# Patient Record
Sex: Male | Born: 1990
Health system: Southern US, Community
[De-identification: ages and names within clinical notes are randomized; demographics above are authoritative.]

## PROBLEM LIST (undated history)

## (undated) DIAGNOSIS — J45909 Unspecified asthma, uncomplicated: Secondary | ICD-10-CM

## (undated) DIAGNOSIS — I10 Essential (primary) hypertension: Secondary | ICD-10-CM

## (undated) DIAGNOSIS — G43909 Migraine, unspecified, not intractable, without status migrainosus: Secondary | ICD-10-CM

## (undated) DIAGNOSIS — T7840XA Allergy, unspecified, initial encounter: Secondary | ICD-10-CM

## (undated) HISTORY — DX: Allergy, unspecified, initial encounter: T78.40XA

## (undated) HISTORY — DX: Essential (primary) hypertension: I10

## (undated) HISTORY — DX: Migraine, unspecified, not intractable, without status migrainosus: G43.909

## (undated) HISTORY — PX: TONSILLECTOMY: SUR1361

---

## 2019-01-04 ENCOUNTER — Ambulatory Visit (HOSPITAL_COMMUNITY)
Admission: EM | Admit: 2019-01-04 | Discharge: 2019-01-04 | Disposition: A | Discharge status: Home / Self Care | Payer: 59

## 2019-01-04 ENCOUNTER — Ambulatory Visit (INDEPENDENT_AMBULATORY_CARE_PROVIDER_SITE_OTHER): Payer: 59

## 2019-01-04 ENCOUNTER — Encounter (HOSPITAL_COMMUNITY): Payer: Self-pay | Admitting: Family Medicine

## 2019-01-04 DIAGNOSIS — S6991XA Unspecified injury of right wrist, hand and finger(s), initial encounter: Secondary | ICD-10-CM

## 2019-01-04 HISTORY — DX: Unspecified asthma, uncomplicated: J45.909
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
## 2019-01-04 NOTE — Discharge Instructions (Signed)
The x ray didn't show any acute fracture.  You need to see a hand specialist to rule out any tendon injury.  I would splint the finger for now.  Ibuprofen for pain

## 2019-04-29 ENCOUNTER — Ambulatory Visit: Payer: BC Managed Care – PPO | Admitting: Medical

## 2019-04-29 ENCOUNTER — Encounter: Payer: Self-pay | Admitting: Medical

## 2019-04-29 ENCOUNTER — Other Ambulatory Visit: Payer: Self-pay

## 2019-04-29 VITALS — BP 130/82 | HR 94 | Temp 97.7°F | Wt 275.4 lb

## 2019-04-29 DIAGNOSIS — K529 Noninfective gastroenteritis and colitis, unspecified: Secondary | ICD-10-CM

## 2019-04-29 DIAGNOSIS — Z1159 Encounter for screening for other viral diseases: Secondary | ICD-10-CM

## 2019-04-29 DIAGNOSIS — J4541 Moderate persistent asthma with (acute) exacerbation: Secondary | ICD-10-CM

## 2019-04-29 DIAGNOSIS — Z299 Encounter for prophylactic measures, unspecified: Secondary | ICD-10-CM

## 2019-04-29 DIAGNOSIS — Z79899 Other long term (current) drug therapy: Secondary | ICD-10-CM

## 2019-04-29 DIAGNOSIS — Z113 Encounter for screening for infections with a predominantly sexual mode of transmission: Secondary | ICD-10-CM | POA: Diagnosis not present

## 2019-04-29 MED ORDER — MONTELUKAST SODIUM 10 MG PO TABS: 10.0000 mg | ORAL_TABLET | Freq: Every day | ORAL | 3 refills | Status: DC

## 2019-04-29 MED ORDER — EMTRICITABINE-TENOFOVIR DF 200-300 MG PO TABS: 1.0000 | ORAL_TABLET | Freq: Every day | ORAL | 0 refills | Status: DC

## 2019-04-29 MED ORDER — BECLOMETHASONE DIPROP HFA 40 MCG/ACT IN AERB: 1.0000 | INHALATION_SPRAY | Freq: Two times a day (BID) | RESPIRATORY_TRACT | 5 refills | Status: DC

## 2019-04-29 MED ORDER — ALBUTEROL SULFATE HFA 108 (90 BASE) MCG/ACT IN AERS: 2.0000 | INHALATION_SPRAY | Freq: Four times a day (QID) | RESPIRATORY_TRACT | 1 refills | Status: DC | PRN

## 2019-04-29 NOTE — Progress Notes (Signed)
Subjective:  Albert Hatfield is a 29 y.o. male who presents for Chief Complaint  Patient presents with  . other    new pt. est. and asthma issues and allergies also needs refills on both inhalers albuterol and qvar, was also on prep Truvada would like to switch to Descovy if its better     Here as a new patient today.  Was seeing another provider in [facility] prior  He has a history of asthma.  He has been out of his Singulair and Qvar in the last month.  Having more wheezing of late.  He needs refills.  When he is on the medicines regularly he only has to use the albuterol maybe every other week.  He has asthma problems year-round and is on long-term preventative medication.  he does not smoke cigarettes but he does smoke marijuana  He has a history of high blood pressure.  However this past year after weight loss through healthy diet, exercise and stress reduction leaving a stressful job his blood pressure is improved.  He does note that his kidney marker creatinine had been elevated on a lab last year.  He was concerned whether this was medication related or related to something else  Lately he has been having some loose stools for the last several months.  He can get loose stool 2-3 times per week lasting a few days at a time.  But he also has formed stool as well.  No blood in the stool.  No frequent diarrhea more than 3 times per day.  No recent travel, no new animal exposure.  No recent antibiotic use.  No sick contacts.  He is on PrEP medication.  He is bisexual.  He is considering making a switch to Descovy as he heard some concern about kidney issues with Truvada.  He has been on this about a year.  He is due for STD testing.  No recent symptoms.  No other aggravating or relieving factors.    No other c/o.  Past Medical History:  Diagnosis Date  . Asthma    No current outpatient medications on file prior to visit.   No current facility-administered medications on file  prior to visit.     The following portions of the patient's history were reviewed and updated as appropriate: allergies, current medications, past family history, past medical history, past social history, past surgical history and problem list.  ROS Otherwise as in subjective above    Objective: BP 130/82   Pulse 94   Temp 97.7 F (36.5 C)   Wt 275 lb 6.4 oz (124.9 kg)   General appearance: alert, no distress, well developed, well nourished, African American male Neck: supple, no lymphadenopathy, no thyromegaly, no masses Heart: RRR, normal S1, S2, no murmurs Lungs: CTA bilaterally, no wheezes, rhonchi, or rales Abdomen: +bs, soft, non tender, non distended, no masses, no hepatomegaly, no splenomegaly Pulses: 2+ radial pulses, 2+ pedal pulses, normal cap refill Ext: no edema   Assessment: Encounter Diagnoses  Name Primary?  . Moderate persistent asthma with acute exacerbation Yes  . Chronic diarrhea   . Screen for STD (sexually transmitted disease)   . Prophylactic measure   . High risk medication use   . Need for hepatitis B screening test      Plan: Asthma-refilled Qvar and Singulair to help prevent flareups.  Continue albuterol as needed.  Discussed proper use of medication.  Prophylactic measure, screen for STD-labs today, refill Truvada but consider change  to Descovy.  Discussed condom use, discussed preventative measures.  Plan to follow-up in 3 months  Chronic diarrhea-we discussed possible causes.  This could be related to food changes from where he has made lifestyle changes trying to lose weight.  Pending labs consider stool studies if needed.  Consider food allergy or other causes.    Albert Hatfield was seen today for other.  Diagnoses and all orders for this visit:  Moderate persistent asthma with acute exacerbation -     CBC  Chronic diarrhea -     Comprehensive metabolic panel -     CBC -     TSH  Screen for STD (sexually transmitted disease) -      HIV Antibody (routine testing w rflx) -     RPR -     GC/Chlamydia Probe Amp -     Hepatitis C antibody -     Hepatitis B surface antigen -     Hepatitis B surface antibody,quantitative  Prophylactic measure -     HIV Antibody (routine testing w rflx) -     RPR -     GC/Chlamydia Probe Amp -     Hepatitis C antibody -     Hepatitis B surface antigen -     Hepatitis B surface antibody,quantitative  High risk medication use -     Comprehensive metabolic panel -     CBC -     TSH  Need for hepatitis B screening test -     Hepatitis B surface antigen -     Hepatitis B surface antibody,quantitative  Other orders -     albuterol (VENTOLIN HFA) 108 (90 Base) MCG/ACT inhaler; Inhale 2 puffs into the lungs every 6 (six) hours as needed for wheezing or shortness of breath. -     beclomethasone (QVAR) 40 MCG/ACT inhaler; Inhale 1 puff into the lungs 2 (two) times daily. -     montelukast (SINGULAIR) 10 MG tablet; Take 1 tablet (10 mg total) by mouth at bedtime. -     emtricitabine-tenofovir (TRUVADA) 200-300 MG tablet; Take 1 tablet by mouth daily.    Follow up: pending labs

## 2019-04-30 LAB — CBC
Hematocrit: 50 % (ref 37.5–51.0)
Hemoglobin: 16.3 g/dL (ref 13.0–17.7)
MCH: 27.7 pg (ref 26.6–33.0)
MCHC: 32.6 g/dL (ref 31.5–35.7)
MCV: 85 fL (ref 79–97)
Platelets: 259 10*3/uL (ref 150–450)
RBC: 5.89 x10E6/uL — ABNORMAL HIGH (ref 4.14–5.80)
RDW: 14.8 % (ref 11.6–15.4)
WBC: 9 10*3/uL (ref 3.4–10.8)

## 2019-04-30 LAB — HIV ANTIBODY (ROUTINE TESTING W REFLEX): HIV Screen 4th Generation wRfx: NONREACTIVE

## 2019-04-30 LAB — COMPREHENSIVE METABOLIC PANEL
ALT: 24 IU/L (ref 0–44)
AST: 22 IU/L (ref 0–40)
Albumin/Globulin Ratio: 1.7 (ref 1.2–2.2)
Albumin: 4.9 g/dL (ref 4.1–5.2)
Alkaline Phosphatase: 67 IU/L (ref 39–117)
BUN/Creatinine Ratio: 12 (ref 9–20)
BUN: 15 mg/dL (ref 6–20)
Bilirubin Total: 0.4 mg/dL (ref 0.0–1.2)
CO2: 22 mmol/L (ref 20–29)
Calcium: 10.1 mg/dL (ref 8.7–10.2)
Chloride: 102 mmol/L (ref 96–106)
Creatinine, Ser: 1.3 mg/dL — ABNORMAL HIGH (ref 0.76–1.27)
GFR calc Af Amer: 87 mL/min/{1.73_m2} (ref >59)
GFR calc non Af Amer: 75 mL/min/{1.73_m2} (ref >59)
Globulin, Total: 2.9 g/dL (ref 1.5–4.5)
Glucose: 94 mg/dL (ref 65–99)
Potassium: 4.9 mmol/L (ref 3.5–5.2)
Sodium: 142 mmol/L (ref 134–144)
Total Protein: 7.8 g/dL (ref 6.0–8.5)

## 2019-04-30 LAB — HEPATITIS B SURFACE ANTIGEN: Hepatitis B Surface Ag: NEGATIVE

## 2019-04-30 LAB — TSH: TSH: 0.791 u[IU]/mL (ref 0.450–4.500)

## 2019-04-30 LAB — HEPATITIS C ANTIBODY: Hep C Virus Ab: 0.1 s/co ratio (ref 0.0–0.9)

## 2019-04-30 LAB — HEPATITIS B SURFACE ANTIBODY, QUANTITATIVE: Hepatitis B Surf Ab Quant: 3.8 m[IU]/mL — ABNORMAL LOW (ref >9.9)

## 2019-04-30 LAB — RPR: RPR Ser Ql: NONREACTIVE

## 2019-05-02 ENCOUNTER — Other Ambulatory Visit: Payer: Self-pay | Admitting: Medical

## 2019-05-02 MED ORDER — DESCOVY 200-25 MG PO TABS: 1.0000 | ORAL_TABLET | Freq: Every day | ORAL | 0 refills | Status: DC

## 2019-05-03 LAB — GC/CHLAMYDIA PROBE AMP
Chlamydia trachomatis, NAA: NEGATIVE
Neisseria Gonorrhoeae by PCR: NEGATIVE

## 2019-05-05 ENCOUNTER — Other Ambulatory Visit: Payer: Self-pay | Admitting: Medical

## 2019-07-20 ENCOUNTER — Other Ambulatory Visit: Payer: Self-pay

## 2019-07-20 ENCOUNTER — Encounter: Payer: Self-pay | Admitting: Medical

## 2019-07-20 ENCOUNTER — Ambulatory Visit (INDEPENDENT_AMBULATORY_CARE_PROVIDER_SITE_OTHER): Payer: BC Managed Care – PPO | Admitting: Medical

## 2019-07-20 VITALS — BP 112/82 | HR 85 | Ht 71.0 in | Wt 271.8 lb

## 2019-07-20 DIAGNOSIS — Z0000 Encounter for general adult medical examination without abnormal findings: Secondary | ICD-10-CM | POA: Diagnosis not present

## 2019-07-20 DIAGNOSIS — Z23 Encounter for immunization: Secondary | ICD-10-CM

## 2019-07-20 DIAGNOSIS — J301 Allergic rhinitis due to pollen: Secondary | ICD-10-CM | POA: Diagnosis not present

## 2019-07-20 DIAGNOSIS — Z7185 Encounter for immunization safety counseling: Secondary | ICD-10-CM | POA: Insufficient documentation

## 2019-07-20 DIAGNOSIS — Z7189 Other specified counseling: Secondary | ICD-10-CM

## 2019-07-20 DIAGNOSIS — Z113 Encounter for screening for infections with a predominantly sexual mode of transmission: Secondary | ICD-10-CM

## 2019-07-20 DIAGNOSIS — J4540 Moderate persistent asthma, uncomplicated: Secondary | ICD-10-CM | POA: Diagnosis not present

## 2019-07-20 DIAGNOSIS — Z87820 Personal history of traumatic brain injury: Secondary | ICD-10-CM | POA: Diagnosis not present

## 2019-07-20 DIAGNOSIS — G43909 Migraine, unspecified, not intractable, without status migrainosus: Secondary | ICD-10-CM

## 2019-07-20 DIAGNOSIS — G4733 Obstructive sleep apnea (adult) (pediatric): Secondary | ICD-10-CM

## 2019-07-20 DIAGNOSIS — Z299 Encounter for prophylactic measures, unspecified: Secondary | ICD-10-CM

## 2019-07-20 DIAGNOSIS — Z6837 Body mass index (BMI) 37.0-37.9, adult: Secondary | ICD-10-CM

## 2019-07-20 NOTE — Progress Notes (Signed)
Subjective:   HPI  Albert Hatfield is a 29 y.o. male who presents for Chief Complaint  Patient presents with  . Annual Exam    with fasting labs     Patient Care Team: Jamaal Bernasconi, Kermit Balo, PA-C as PCP - General (Family Medicine) Sees dentist Sees eye doctor  Concerns: Here for physical today.  I saw him in April as a new patient to establish care  He continues with Descovy prep without complaint.  Here for STD testing.  He has not used the medicine every single day in the last month due to no sexual activity during it.  His only main concern is prior creatinine elevation in the last several years.  He was treated for blood pressure several years ago but lost down from 350 pounds down to current weight He does  not use NSAIDs regularly.  He is concerned as his grandpa was on dialysis.  He lives alone, his mother just recently moved to [place] to be closer.  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   He is trying to adopt his 16-year-old cousin who is in foster care.  His uncle and aunt lost rights to him in [place], and he wants to intervene as a caregiver so he does not end up in foster care with somebody he does not know  He has a history of sleep apnea, severe in the past.  Last sleep study 2018.  He cannot afford a CPAP so he never used CPAP.  He was 300 pounds at that time.   Reviewed their medical, surgical, family, social, medication, and allergy history and updated chart as appropriate.  Past Medical History:  Diagnosis Date  . Allergy   . Asthma   . Hypertension    in past, was on medication at one point 2013, treated with lifestyle changes currently 2021  . Migraine    since prior concussion 2012    Past Surgical History:  Procedure Laterality Date  . TONSILLECTOMY      Social History   Socioeconomic History  . Marital status: Single    Spouse name: Not on file  . Number of children: Not on file  . Years of education: Not on file  .  Highest education level: Not on file  Occupational History  . Not on file  Tobacco Use  . Smoking status: Never Smoker  . Smokeless tobacco: Never Used  Substance and Sexual Activity  . Alcohol use: Yes    Comment: social, limited  . Drug use: Yes    Types: Marijuana    Comment: for migraines  . Sexual activity: Not on file  Other Topics Concern  . Not on file  Social History Narrative  . Not on file   Social Determinants of Health   Financial Resource Strain:   . Difficulty of Paying Living Expenses:   Food Insecurity:   . Worried About Programme researcher, broadcasting/film/video in the Last Year:   . Barista in the Last Year:   Transportation Needs:   . Freight forwarder (Medical):   Marland Kitchen Lack of Transportation (Non-Medical):   Physical Activity:   . Days of Exercise per Week:   . Minutes of Exercise per Session:   Stress:   . Feeling of Stress :   Social Connections:   . Frequency of Communication with Friends and Family:   . Frequency of Social Gatherings with Friends and Family:   . Attends Religious Services:   .  Active Member of Clubs or Organizations:   . Attends Banker Meetings:   Marland Kitchen Marital Status:   Intimate Partner Violence:   . Fear of Current or Ex-Partner:   . Emotionally Abused:   Marland Kitchen Physically Abused:   . Sexually Abused:     Family History  Problem Relation Age of Onset  . Healthy Mother   . Heart failure Father   . Hypertension Father   . Heart disease Father 42       MI  . Cancer Paternal Aunt        breast  . Hypertension Paternal Grandmother   . Hypertension Paternal Grandfather   . Arthritis Other   . Stroke Neg Hx      Current Outpatient Medications:  .  albuterol (VENTOLIN HFA) 108 (90 Base) MCG/ACT inhaler, Inhale 2 puffs into the lungs every 6 (six) hours as needed for wheezing or shortness of breath., Disp: 18 g, Rfl: 1 .  emtricitabine-tenofovir AF (DESCOVY) 200-25 MG tablet, Take 1 tablet by mouth daily., Disp: 90 tablet,  Rfl: 0 .  montelukast (SINGULAIR) 10 MG tablet, Take 1 tablet (10 mg total) by mouth at bedtime., Disp: 90 tablet, Rfl: 3 .  QVAR 40 MCG/ACT inhaler, INHALE 1 PUFF BY MOUTH TWICE DAILY, Disp: 9 g, Rfl: 5  No Known Allergies    Review of Systems Constitutional: -fever, -chills, -sweats, -unexpected weight change, -decreased appetite, -fatigue Allergy: -sneezing, -itching, -congestion Dermatology: -changing moles, --rash, -lumps ENT: -runny nose, -ear pain, -sore throat, -hoarseness, -sinus pain, -teeth pain, - ringing in ears, -hearing loss, -nosebleeds Cardiology: -chest pain, -palpitations, -swelling, -difficulty breathing when lying flat, -waking up short of breath Respiratory: -cough, -shortness of breath, -difficulty breathing with exercise or exertion, -wheezing, -coughing up blood Gastroenterology: -abdominal pain, -nausea, -vomiting, -diarrhea, -constipation, -blood in stool, -changes in bowel movement, -difficulty swallowing or eating Hematology: -bleeding, -bruising  Musculoskeletal: -joint aches, -muscle aches, -joint swelling, -back pain, -neck pain, -cramping, -changes in gait Ophthalmology: denies vision changes, eye redness, itching, discharge Urology: -burning with urination, -difficulty urinating, -blood in urine, -urinary frequency, -urgency, -incontinence Neurology: -headache, -weakness, -tingling, -numbness, -memory loss, -falls, -dizziness Psychology: -depressed mood, -agitation, -sleep problems Male GU: no testicular mass, pain, no lymph nodes swollen, no swelling, no rash.     Objective:  BP 112/82   Pulse 85   Ht 5\' 11"  (1.803 m)   Wt 271 lb 12.8 oz (123.3 kg)   SpO2 98%   BMI 37.91 kg/m   General appearance: alert, no distress, WD/WN, African American male Skin: unremarkable Neck: supple, no lymphadenopathy, no thyromegaly, no masses, normal ROM, no bruits Chest: non tender, normal shape and expansion Heart: RRR, normal S1, S2, no murmurs Lungs: CTA  bilaterally, no wheezes, rhonchi, or rales Abdomen: +bs, soft, non tender, non distended, no masses, no hepatomegaly, no splenomegaly, no bruits Back: non tender, normal ROM, no scoliosis Musculoskeletal: upper extremities non tender, no obvious deformity, normal ROM throughout, lower extremities non tender, no obvious deformity, normal ROM throughout Extremities: no edema, no cyanosis, no clubbing Pulses: 2+ symmetric, upper and lower extremities, normal cap refill Neurological: alert, oriented x 3, CN2-12 intact, strength normal upper extremities and lower extremities, sensation normal throughout, DTRs 2+ throughout, no cerebellar signs, gait normal Psychiatric: normal affect, behavior normal, pleasant  GU: normal male external genitalia,circumcised, nontender, no masses, no hernia, no lymphadenopathy Rectal: deferred  Assessment and Plan :   Encounter Diagnoses  Name Primary?  . Encounter for health maintenance examination  in adult Yes  . Moderate persistent asthma, unspecified whether complicated   . Allergic rhinitis due to pollen, unspecified seasonality   . History of concussion   . Migraine without status migrainosus, not intractable, unspecified migraine type   . Prophylactic measure   . Vaccine counseling   . Screen for STD (sexually transmitted disease)   . Need for hepatitis B vaccination   . OSA (obstructive sleep apnea)   . BMI 37.0-37.9, adult     Physical exam - discussed and counseled on healthy lifestyle, diet, exercise, preventative care, vaccinations, sick and well care, proper use of emergency dept and after hours care, and addressed their concerns.    Health screening: See your eye doctor yearly for routine vision care. See your dentist yearly for routine dental care including hygiene visits twice yearly.  Discussed STD testing, discussed prevention, condom use, means of transmission  Cancer screening Advised monthly self testicular exam     Vaccinations: Advised yearly influenza vaccine He will get me a copy of his vaccine records  Counseled on the Hepatitis B virus vaccine.  Vaccine information sheet given.  Heplisav B# 1 vaccine given after consent obtained.  Patient was advised to return in 1 months for Heplisav B #2.      Separate significant chronic issues discussed: Asthma-compliant with medications, no recent complaints, on preventative daily inhaler  Prophylactic measure-continue Descovy, discussed risk and benefits of medication, STD screen today  History of sleep apnea-we will refer for updated sleep study  Continue efforts to lose weight through healthy diet and exercise  History of migraines, no recent frequent migraines.   Albert Hatfield was seen today for annual exam.  Diagnoses and all orders for this visit:  Encounter for health maintenance examination in adult -     Lipid panel -     Basic metabolic panel -     HIV Antibody (routine testing w rflx) -     RPR -     GC/Chlamydia Probe Amp  Moderate persistent asthma, unspecified whether complicated  Allergic rhinitis due to pollen, unspecified seasonality  History of concussion  Migraine without status migrainosus, not intractable, unspecified migraine type  Prophylactic measure  Vaccine counseling  Screen for STD (sexually transmitted disease) -     HIV Antibody (routine testing w rflx) -     RPR -     GC/Chlamydia Probe Amp  Need for hepatitis B vaccination  OSA (obstructive sleep apnea) -     Home sleep test  BMI 37.0-37.9, adult  Other orders -     Heplisav-B (HepB-CPG) Vaccine     Follow-up pending labs, yearly for physical

## 2019-07-21 ENCOUNTER — Other Ambulatory Visit: Payer: Self-pay | Admitting: Medical

## 2019-07-21 LAB — BASIC METABOLIC PANEL
BUN/Creatinine Ratio: 13 (ref 9–20)
BUN: 14 mg/dL (ref 6–20)
CO2: 23 mmol/L (ref 20–29)
Calcium: 9.7 mg/dL (ref 8.7–10.2)
Chloride: 104 mmol/L (ref 96–106)
Creatinine, Ser: 1.04 mg/dL (ref 0.76–1.27)
GFR calc Af Amer: 113 mL/min/{1.73_m2} (ref >59)
GFR calc non Af Amer: 98 mL/min/{1.73_m2} (ref >59)
Glucose: 104 mg/dL — ABNORMAL HIGH (ref 65–99)
Potassium: 4.3 mmol/L (ref 3.5–5.2)
Sodium: 141 mmol/L (ref 134–144)

## 2019-07-21 LAB — LIPID PANEL
Chol/HDL Ratio: 5.4 ratio — ABNORMAL HIGH (ref 0.0–5.0)
Cholesterol, Total: 225 mg/dL — ABNORMAL HIGH (ref 100–199)
HDL: 42 mg/dL (ref >39)
LDL Chol Calc (NIH): 169 mg/dL — ABNORMAL HIGH (ref 0–99)
Triglycerides: 78 mg/dL (ref 0–149)
VLDL Cholesterol Cal: 14 mg/dL (ref 5–40)

## 2019-07-21 LAB — RPR: RPR Ser Ql: NONREACTIVE

## 2019-07-21 LAB — GC/CHLAMYDIA PROBE AMP
Chlamydia trachomatis, NAA: NEGATIVE
Neisseria Gonorrhoeae by PCR: NEGATIVE

## 2019-07-21 LAB — HIV ANTIBODY (ROUTINE TESTING W REFLEX): HIV Screen 4th Generation wRfx: NONREACTIVE

## 2019-07-21 MED ORDER — DESCOVY 200-25 MG PO TABS: 1.0000 | ORAL_TABLET | Freq: Every day | ORAL | 0 refills | Status: AC

## 2019-07-26 ENCOUNTER — Other Ambulatory Visit: Payer: Self-pay | Admitting: Medical

## 2019-07-26 ENCOUNTER — Telehealth: Payer: Self-pay

## 2019-07-26 DIAGNOSIS — R7309 Other abnormal glucose: Secondary | ICD-10-CM

## 2019-07-26 DIAGNOSIS — E785 Hyperlipidemia, unspecified: Secondary | ICD-10-CM

## 2019-07-26 NOTE — Telephone Encounter (Signed)
Pt. Called stating that he has sent you a few my chart messages stating that he would like to get his Cholesterol levels rechecked sooner than 3-4 months from now because he was not fasting when his labs were drawn last time. He also feels like he wants to switch providers because you did not listen to him. I told him I would have to send a message back to both providers before being able to switch his PCP. He stated again if there was anyway he could have his cholesterol rechecked sooner since his last one was not fasting and he would like to make sure before he starts any kind of medication.

## 2019-07-26 NOTE — Telephone Encounter (Signed)
Patient has been informed of provider message.  

## 2019-07-26 NOTE — Telephone Encounter (Signed)
First of all I am listening to him and read his responses and questions on email.  I have to make several considerations when we do labs and when we recommend treatment.   Given his age and lack of other major heart disease risk factors, we would not necessarily put him on cholesterol medicine at this point unless we strongly felt the numbers were abnormal, that the medicines he is on was adversely affecting his cholesterol, or if he had genetic predisposition to high cholesterol that he could not change with diet and exercise.    Most insurers will not pay for labs for cholesterol this soon.  Typically an insurance company will not pay for a repeat cholesterol unless its at least 6 weeks after the prior test.  Thus, this is yet another reason why we do not need to do the test that quickly.  Most insurance companies will only pay for cholesterol once yearly anyhow.  For young healthy people without significant heart disease risk factors, without medication that adversely affects the cholesterol, the standard recommendation is to check cholesterol once every 5 years.     So yes may be he feels as though I am being resistant to repeating the test but is for all the reasons above.  I would be happy to repeat the test fasting in 6 weeks at the earliest or in 3 months at his next lab check.  Bottom line the most important thing he can do is eat a low-fat low-cholesterol diet, exercise regularly.  I feel I am very thorough, I have reviewed his chart, and address concerns from the get go.    If he feels as though he needs to find another provider in another office, then he certainly has the freedom to choose who he sees.

## 2019-08-22 ENCOUNTER — Other Ambulatory Visit: Payer: BC Managed Care – PPO

## 2019-10-13 ENCOUNTER — Telehealth: Payer: Self-pay | Admitting: Medical

## 2019-10-13 NOTE — Telephone Encounter (Signed)
Please let him know that yes he can come in in advance of his visit.  I will need to put in labs so send this message back to me.  However I am out of the office till next week due to surgery

## 2019-10-14 NOTE — Telephone Encounter (Signed)
Patient has been informed via mychart. Please place lab order

## 2019-10-17 ENCOUNTER — Other Ambulatory Visit: Payer: Self-pay | Admitting: Medical

## 2019-10-17 DIAGNOSIS — E785 Hyperlipidemia, unspecified: Secondary | ICD-10-CM

## 2019-10-17 DIAGNOSIS — Z6837 Body mass index (BMI) 37.0-37.9, adult: Secondary | ICD-10-CM

## 2019-10-17 DIAGNOSIS — Z113 Encounter for screening for infections with a predominantly sexual mode of transmission: Secondary | ICD-10-CM

## 2019-10-17 DIAGNOSIS — R7301 Impaired fasting glucose: Secondary | ICD-10-CM

## 2019-11-02 ENCOUNTER — Ambulatory Visit: Payer: BC Managed Care – PPO | Admitting: Medical

## 2019-11-03 ENCOUNTER — Encounter: Payer: Self-pay | Admitting: Medical

## 2020-07-31 ENCOUNTER — Encounter: Payer: BC Managed Care – PPO | Admitting: Medical

## 2020-07-31 ENCOUNTER — Telehealth: Payer: Self-pay | Admitting: Medical

## 2020-07-31 NOTE — Telephone Encounter (Signed)

## 2020-08-06 ENCOUNTER — Encounter: Payer: Self-pay | Admitting: Family Medicine

## 2020-08-06 NOTE — Telephone Encounter (Signed)
Dismissal letter sent.

## 2020-09-13 ENCOUNTER — Emergency Department (HOSPITAL_COMMUNITY): Payer: PRIVATE HEALTH INSURANCE

## 2020-09-13 ENCOUNTER — Emergency Department (HOSPITAL_COMMUNITY)
Admission: EM | Admit: 2020-09-13 | Discharge: 2020-09-13 | Disposition: A | Discharge status: Home / Self Care | Payer: PRIVATE HEALTH INSURANCE | Attending: Emergency Medicine | Admitting: Emergency Medicine

## 2020-09-13 ENCOUNTER — Encounter (HOSPITAL_COMMUNITY): Payer: Self-pay | Admitting: Emergency Medicine

## 2020-09-13 DIAGNOSIS — Z79899 Other long term (current) drug therapy: Secondary | ICD-10-CM | POA: Insufficient documentation

## 2020-09-13 DIAGNOSIS — J4521 Mild intermittent asthma with (acute) exacerbation: Secondary | ICD-10-CM | POA: Insufficient documentation

## 2020-09-13 DIAGNOSIS — I10 Essential (primary) hypertension: Secondary | ICD-10-CM | POA: Insufficient documentation

## 2020-09-13 DIAGNOSIS — Z20822 Contact with and (suspected) exposure to covid-19: Secondary | ICD-10-CM | POA: Insufficient documentation

## 2020-09-13 LAB — RESP PANEL BY RT-PCR (FLU A&B, COVID) ARPGX2
Influenza A by PCR: NEGATIVE
Influenza B by PCR: NEGATIVE
SARS Coronavirus 2 by RT PCR: NEGATIVE

## 2020-09-13 MED ORDER — DEXAMETHASONE SODIUM PHOSPHATE 10 MG/ML IJ SOLN
10.0000 mg | Freq: Once | INTRAMUSCULAR | Status: DC
  Filled 2020-09-13: qty 1

## 2020-09-13 MED ORDER — IPRATROPIUM-ALBUTEROL 0.5-2.5 (3) MG/3ML IN SOLN
3.0000 mL | Freq: Once | RESPIRATORY_TRACT | Status: AC
  Administered 2020-09-13: 3 mL via RESPIRATORY_TRACT
  Filled 2020-09-13: qty 3

## 2020-09-13 MED ORDER — AEROCHAMBER PLUS FLO-VU LARGE MISC
1.0000 | Freq: Once | Status: AC
  Administered 2020-09-13: 1

## 2020-09-13 MED ORDER — AEROCHAMBER PLUS FLO-VU LARGE MISC
Status: AC
  Filled 2020-09-13: qty 1

## 2020-09-13 MED ORDER — DEXAMETHASONE SODIUM PHOSPHATE 10 MG/ML IJ SOLN
10.0000 mg | Freq: Once | INTRAMUSCULAR | Status: AC
  Administered 2020-09-13: 10 mg via INTRAVENOUS

## 2020-09-13 MED ORDER — ALBUTEROL SULFATE HFA 108 (90 BASE) MCG/ACT IN AERS
2.0000 | INHALATION_SPRAY | RESPIRATORY_TRACT | Status: DC | PRN
Start: 2020-09-13 — End: 2020-09-13
  Administered 2020-09-13: 2 via RESPIRATORY_TRACT
  Filled 2020-09-13: qty 6.7

## 2020-09-13 MED ORDER — PREDNISONE 10 MG (21) PO TBPK: ORAL_TABLET | Freq: Every day | ORAL | 0 refills | Status: DC

## 2020-09-13 MED ORDER — ALBUTEROL SULFATE HFA 108 (90 BASE) MCG/ACT IN AERS: 2.0000 | INHALATION_SPRAY | Freq: Four times a day (QID) | RESPIRATORY_TRACT | 1 refills | Status: DC | PRN

## 2020-09-13 NOTE — ED Provider Notes (Signed)
[facility] EMERGENCY DEPARTMENT Provider Note   CSN: [id number] Arrival date & time: 09/13/20  1234     History Chief Complaint  Patient presents with   Asthma    Albert Hatfield is a 30 y.o. male.  Pt presents to the ED today with sob and wheezing.  Pt said he has not felt well for the past 2 days.  He has had to use his inhaler yesterday and is now out.  The pt denies any f/c.      Past Medical History:  Diagnosis Date   Allergy    Asthma    Hypertension    in past, was on medication at one point 2013, treated with lifestyle changes currently 2021   Migraine    since prior concussion 2012    Patient Active Problem List   Diagnosis Date Noted   Encounter for health maintenance examination in adult 07/20/2019   Moderate persistent asthma 07/20/2019   Allergic rhinitis due to pollen 07/20/2019   History of concussion 07/20/2019   Migraine without status migrainosus, not intractable 07/20/2019   Prophylactic measure 07/20/2019   Vaccine counseling 07/20/2019   Screen for STD (sexually transmitted disease) 07/20/2019   Need for hepatitis B vaccination 07/20/2019   OSA (obstructive sleep apnea) 07/20/2019   BMI 37.0-37.9, adult 07/20/2019    Past Surgical History:  Procedure Laterality Date   TONSILLECTOMY         Family History  Problem Relation Age of Onset   Healthy Mother    Heart failure Father    Hypertension Father    Heart disease Father 16       MI   Cancer Paternal Aunt        breast   Hypertension Paternal Grandmother    Hypertension Paternal Grandfather    Arthritis Other    Stroke Neg Hx     Social History   Tobacco Use   Smoking status: Never   Smokeless tobacco: Never  Substance Use Topics   Alcohol use: Yes    Comment: social, limited   Drug use: Yes    Types: Marijuana    Comment: for migraines    Home Medications Prior to Admission medications   Medication Sig Start Date End Date Taking? Authorizing  Provider  acetaminophen (TYLENOL) 325 MG tablet Take 650 mg by mouth every 6 (six) hours as needed for headache or mild pain.   Yes [provider]  Ascorbic Acid (VITAMIN C) 1000 MG tablet Take 1,000 mg by mouth daily.   Yes [provider]  emtricitabine-tenofovir AF (DESCOVY) 200-25 MG tablet Take 1 tablet by mouth daily. 07/21/19  Yes Tysinger, Kermit Balo, PA-C  multivitamin (ONE-A-DAY MEN'S) TABS tablet Take 1 tablet by mouth daily.   Yes [provider]  Omega-3 1000 MG CAPS Take 1 capsule by mouth daily.   Yes [provider]  predniSONE (STERAPRED UNI-PAK 21 TAB) 10 MG (21) TBPK tablet Take by mouth daily. Take 6 tabs by mouth daily  for 2 days, then 5 tabs for 2 days, then 4 tabs for 2 days, then 3 tabs for 2 days, 2 tabs for 2 days, then 1 tab by mouth daily for 2 days 09/13/20  Yes Jacalyn Lefevre, MD  QVAR 40 MCG/ACT inhaler INHALE 1 PUFF BY MOUTH TWICE DAILY Patient taking differently: Inhale 1 puff into the lungs in the morning and at bedtime. 05/05/19  Yes Tysinger, Kermit Balo, PA-C  valACYclovir (VALTREX) 1000 MG tablet Take 1,000  mg by mouth 3 (three) times daily. 06/22/20  Yes [provider]  albuterol (VENTOLIN HFA) 108 (90 Base) MCG/ACT inhaler Inhale 2 puffs into the lungs every 6 (six) hours as needed for wheezing or shortness of breath. 09/13/20   Jacalyn Lefevre, MD  montelukast (SINGULAIR) 10 MG tablet Take 1 tablet (10 mg total) by mouth at bedtime. Patient not taking: No sig reported 04/29/19   Jac Canavan, PA-C    Allergies    Patient has no known allergies.  Review of Systems   Review of Systems  Respiratory:  Positive for shortness of breath and wheezing.   All other systems reviewed and are negative.  Physical Exam Updated Vital Signs BP 124/74   Pulse (!) 56   Temp 98.6 F (37 C) (Oral)   Resp 18   SpO2 100%   Physical Exam Vitals and nursing note reviewed.  Constitutional:      Appearance: Normal appearance.   HENT:     Head: Normocephalic and atraumatic.     Right Ear: External ear normal.     Left Ear: External ear normal.     Nose: Nose normal.     Mouth/Throat:     Mouth: Mucous membranes are moist.     Pharynx: Oropharynx is clear.  Eyes:     Extraocular Movements: Extraocular movements intact.     Conjunctiva/sclera: Conjunctivae normal.     Pupils: Pupils are equal, round, and reactive to light.  Cardiovascular:     Rate and Rhythm: Normal rate and regular rhythm.     Pulses: Normal pulses.     Heart sounds: Normal heart sounds.  Pulmonary:     Effort: Pulmonary effort is normal.     Breath sounds: Wheezing present.  Abdominal:     General: Abdomen is flat. Bowel sounds are normal.     Palpations: Abdomen is soft.  Musculoskeletal:        General: Normal range of motion.     Cervical back: Normal range of motion and neck supple.  Skin:    General: Skin is warm.     Capillary Refill: Capillary refill takes less than 2 seconds.  Neurological:     General: No focal deficit present.     Mental Status: He is alert and oriented to person, place, and time.  Psychiatric:        Mood and Affect: Mood normal.        Behavior: Behavior normal.        Thought Content: Thought content normal.    ED Results / Procedures / Treatments   Labs (all labs ordered are listed, but only abnormal results are displayed) Labs Reviewed  RESP PANEL BY RT-PCR (FLU A&B, COVID) ARPGX2    EKG EKG Interpretation  Date/Time:  Thursday September 13 2020 12:37:12 EDT Ventricular Rate:  76 PR Interval:  152 QRS Duration: 88 QT Interval:  370 QTC Calculation: 416 R Axis:   89 Text Interpretation: Normal sinus rhythm Nonspecific T wave abnormality Abnormal ECG No old tracing to compare Confirmed by Jacalyn Lefevre [phone]) on 09/13/2020 4:24:53 PM  Radiology DG Chest 1 View  Result Date: 09/13/2020 CLINICAL DATA:  A 30 year old male with chest pain and shortness of breath. EXAM: CHEST  1 VIEW  COMPARISON:  None FINDINGS: The heart size and mediastinal contours are within normal limits. Both lungs are clear. The visualized skeletal structures are unremarkable. IMPRESSION: No active disease. Electronically Signed   By: Jason Fila.D.  On: 09/13/2020 13:35    Procedures Procedures   Medications Ordered in ED Medications  albuterol (VENTOLIN HFA) 108 (90 Base) MCG/ACT inhaler 2 puff (has no administration in time range)  AeroChamber Plus Flo-Vu Large MISC 1 each (0 each Other Hold 09/13/20 1703)  ipratropium-albuterol (DUONEB) 0.5-2.5 (3) MG/3ML nebulizer solution 3 mL (3 mLs Nebulization Given 09/13/20 1655)  dexamethasone (DECADRON) injection 10 mg (10 mg Intravenous Given 09/13/20 1705)    ED Course  I have reviewed the triage vital signs and the nursing notes.  Pertinent labs & imaging results that were available during my care of the patient were reviewed by me and considered in my medical decision making (see chart for details).    MDM Rules/Calculators/A&P                           Pt is feeling better after decadron and neb.  He is given an inhaler and spacer.  He is d/c with an albuterol refill and prednisone.  Return if worse.  F/u with pcp. Final Clinical Impression(s) / ED Diagnoses Final diagnoses:  Mild intermittent asthma with exacerbation    Rx / DC Orders ED Discharge Orders          Ordered    albuterol (VENTOLIN HFA) 108 (90 Base) MCG/ACT inhaler  Every 6 hours PRN        09/13/20 1747    predniSONE (STERAPRED UNI-PAK 21 TAB) 10 MG (21) TBPK tablet  Daily        09/13/20 1747             Jacalyn Lefevre, MD 09/13/20 1749

## 2020-09-13 NOTE — ED Provider Notes (Signed)
Emergency Medicine Provider Triage Evaluation Note  Albert Hatfield , a 30 y.o. male  was evaluated in triage.  Pt complains of shortness of breath that started earlier this morning.  Shortness of breath associated with wheezing and chest tightness.  Patient has a history of asthma and notes it feels similar to his past asthma exacerbations.  He took albuterol a few times this morning, last used 1 hour prior to arrival.  He also endorses fatigue for the past few days.  No fever or chills.  No cough.  Denies sick contacts or known COVID exposures. No history of blood clots.  Review of Systems  Positive: SOB, wheeze, chest tightness Negative: Lower extremity edema  Physical Exam  BP 136/86 (BP Location: Right Arm)   Pulse 76   Temp 98.6 F (37 C) (Oral)   Resp 16   SpO2 100%  Gen:   Awake, no distress   Resp:  Normal effort  MSK:   Moves extremities without difficulty  Other:    Medical Decision Making  Medically screening exam initiated at 12:52 PM.  Appropriate orders placed.  Albert Hatfield was informed that the remainder of the evaluation will be completed by another provider, this initial triage assessment does not replace that evaluation, and the importance of remaining in the ED until their evaluation is complete.  No wheeze heard on exam, likely due to recent albuterol use CXR COVID test   Albert Stabile, PA-C 09/13/20 1254    Gerhard Munch, MD 09/13/20 [phone]

## 2020-09-13 NOTE — ED Triage Notes (Signed)
Patient complains of wheezing and tightness in his chest for the last few days, history of asthma. Patient states he took the last of his albuterol inhaler this morning.  Patient alert, oriented, and in no apparent distress at this time.

## 2020-11-01 ENCOUNTER — Encounter (HOSPITAL_COMMUNITY): Payer: Self-pay | Admitting: Emergency Medicine

## 2020-11-01 ENCOUNTER — Other Ambulatory Visit: Payer: Self-pay

## 2020-11-01 ENCOUNTER — Emergency Department (HOSPITAL_COMMUNITY): Payer: Self-pay

## 2020-11-01 ENCOUNTER — Emergency Department (HOSPITAL_COMMUNITY)
Admission: EM | Admit: 2020-11-01 | Discharge: 2020-11-01 | Disposition: A | Discharge status: Home / Self Care | Payer: Self-pay | Attending: Emergency Medicine | Admitting: Emergency Medicine

## 2020-11-01 DIAGNOSIS — J45909 Unspecified asthma, uncomplicated: Secondary | ICD-10-CM | POA: Insufficient documentation

## 2020-11-01 DIAGNOSIS — Z20822 Contact with and (suspected) exposure to covid-19: Secondary | ICD-10-CM | POA: Insufficient documentation

## 2020-11-01 DIAGNOSIS — J4521 Mild intermittent asthma with (acute) exacerbation: Secondary | ICD-10-CM

## 2020-11-01 DIAGNOSIS — R059 Cough, unspecified: Secondary | ICD-10-CM | POA: Insufficient documentation

## 2020-11-01 DIAGNOSIS — I10 Essential (primary) hypertension: Secondary | ICD-10-CM | POA: Insufficient documentation

## 2020-11-01 DIAGNOSIS — R0789 Other chest pain: Secondary | ICD-10-CM | POA: Insufficient documentation

## 2020-11-01 DIAGNOSIS — Z7951 Long term (current) use of inhaled steroids: Secondary | ICD-10-CM | POA: Insufficient documentation

## 2020-11-01 DIAGNOSIS — R0602 Shortness of breath: Secondary | ICD-10-CM | POA: Insufficient documentation

## 2020-11-01 LAB — RESP PANEL BY RT-PCR (FLU A&B, COVID) ARPGX2
Influenza A by PCR: NEGATIVE
Influenza B by PCR: NEGATIVE
SARS Coronavirus 2 by RT PCR: NEGATIVE

## 2020-11-01 MED ORDER — ALBUTEROL SULFATE HFA 108 (90 BASE) MCG/ACT IN AERS: 2.0000 | INHALATION_SPRAY | Freq: Four times a day (QID) | RESPIRATORY_TRACT | 0 refills | Status: DC | PRN

## 2020-11-01 MED ORDER — IPRATROPIUM-ALBUTEROL 0.5-2.5 (3) MG/3ML IN SOLN
3.0000 mL | Freq: Once | RESPIRATORY_TRACT | Status: AC
  Administered 2020-11-01: 3 mL via RESPIRATORY_TRACT
  Filled 2020-11-01: qty 3

## 2020-11-01 MED ORDER — PREDNISONE 10 MG (21) PO TBPK: ORAL_TABLET | Freq: Every day | ORAL | 0 refills | Status: DC

## 2020-11-01 MED ORDER — ALBUTEROL SULFATE HFA 108 (90 BASE) MCG/ACT IN AERS: 2.0000 | INHALATION_SPRAY | RESPIRATORY_TRACT | 0 refills | Status: DC | PRN

## 2020-11-01 MED ORDER — PREDNISONE 20 MG PO TABS
60.0000 mg | ORAL_TABLET | Freq: Once | ORAL | Status: AC
  Administered 2020-11-01: 60 mg via ORAL
  Filled 2020-11-01: qty 3

## 2020-11-01 NOTE — ED Provider Notes (Signed)
Emergency Medicine Provider Triage Evaluation Note  Albert Hatfield , a 29 y.o. male  was evaluated in triage.  Pt complains of shortness of breath and cough.  Patient has a history of asthma, he has been short of breath and wheezing the last 3 days.  Also reports off-and-on cough.  Reports there is a mold problem in his building, thinks that could be an aggravating factor.Marland Kitchen  He is using the albuterol inhaler every hour, uses it 3 times a day.  Review of Systems  Positive: Wheezing, cough, shortness of breath Negative: Chest pain  Physical Exam  BP (!) 154/97 (BP Location: Left Arm)   Pulse 66   Temp 98.8 F (37.1 C) (Oral)   Resp 16   SpO2 100%  Gen:   Awake, no distress  F2 Resp:  Normal effort no expiratory wheezing auscultated MSK:   Moves extremities without difficulty  Other:    Medical Decision Making  Medically screening exam initiated at 3:19 PM.  Appropriate orders placed.  Albert Hatfield was informed that the remainder of the evaluation will be completed by another provider, this initial triage assessment does not replace that evaluation, and the importance of remaining in the ED until their evaluation is complete.  Possible asthma exacerbation, patient is not wheezing on exam.  Stable to be seen in the back, not in respiratory distress   Theron Arista, PA-C 11/01/20 1520    Sloan Leiter, DO 11/02/20 2002

## 2020-11-01 NOTE — ED Triage Notes (Signed)
Pt with hx asthma, c/o wheezing. States he has had to use his albuterol inhaler 3x in last 3 hours.

## 2020-11-01 NOTE — Discharge Instructions (Signed)
IYou are seen in the ER today for your asthma exacerbation.  You were given a breathing treatment with improvement in symptoms.  You have been prescribed an albuterol inhaler to use as needed as well as steroids to take for the next few days to prevent rebound of your chest tightness.   You tested negative for COVID and your chest x-ray is reassuring.  You may follow-up with your primary care doctor and return to the ER with any new severe symptoms.

## 2020-11-01 NOTE — ED Provider Notes (Signed)
[facility] EMERGENCY DEPARTMENT Provider Note   CSN: [id number] Arrival date & time: 11/01/20  1512     History Chief Complaint  Patient presents with   Asthma    Albert Hatfield is a 30 y.o. male with history of asthma who presents with 3 days of wheezing with worsening of his shortness of breath today.  Has been using his albuterol 3 times today without significant improvement.  Does state he feels improvement in his wheezing since arriving to the ED but still feels like his chest is very tight.  Denies any congestion, runny nose but does endorse some dry cough for the last 3 days.  Denies any other infectious symptomatology.  I personally reviewed this patient's medical records.  His history of asthma, hypertension, and migraines.  He reports that he not on any medications every day.  HPI     Past Medical History:  Diagnosis Date   Allergy    Asthma    Hypertension    in past, was on medication at one point 2013, treated with lifestyle changes currently 2021   Migraine    since prior concussion 2012    Patient Active Problem List   Diagnosis Date Noted   Encounter for health maintenance examination in adult 07/20/2019   Moderate persistent asthma 07/20/2019   Allergic rhinitis due to pollen 07/20/2019   History of concussion 07/20/2019   Migraine without status migrainosus, not intractable 07/20/2019   Prophylactic measure 07/20/2019   Vaccine counseling 07/20/2019   Screen for STD (sexually transmitted disease) 07/20/2019   Need for hepatitis B vaccination 07/20/2019   OSA (obstructive sleep apnea) 07/20/2019   BMI 37.0-37.9, adult 07/20/2019    Past Surgical History:  Procedure Laterality Date   TONSILLECTOMY         Family History  Problem Relation Age of Onset   Healthy Mother    Heart failure Father    Hypertension Father    Heart disease Father 21       MI   Cancer Paternal Aunt        breast   Hypertension Paternal Grandmother     Hypertension Paternal Grandfather    Arthritis Other    Stroke Neg Hx     Social History   Tobacco Use   Smoking status: Never   Smokeless tobacco: Never  Substance Use Topics   Alcohol use: Yes    Comment: social, limited   Drug use: Yes    Types: Marijuana    Comment: for migraines    Home Medications Prior to Admission medications   Medication Sig Start Date End Date Taking? Authorizing Provider  acetaminophen (TYLENOL) 325 MG tablet Take 650 mg by mouth every 6 (six) hours as needed for headache or mild pain.    [provider]  albuterol (VENTOLIN HFA) 108 (90 Base) MCG/ACT inhaler Inhale 2 puffs into the lungs every 6 (six) hours as needed for wheezing or shortness of breath. 09/13/20   Jacalyn Lefevre, MD  Ascorbic Acid (VITAMIN C) 1000 MG tablet Take 1,000 mg by mouth daily.    [provider]  emtricitabine-tenofovir AF (DESCOVY) 200-25 MG tablet Take 1 tablet by mouth daily. 07/21/19   Tysinger, Kermit Balo, PA-C  montelukast (SINGULAIR) 10 MG tablet Take 1 tablet (10 mg total) by mouth at bedtime. Patient not taking: No sig reported 04/29/19   Tysinger, Kermit Balo, PA-C  multivitamin (ONE-A-DAY MEN'S) TABS tablet Take 1 tablet by mouth daily.    [provider]  Omega-3 1000 MG CAPS Take 1 capsule by mouth daily.    [provider]  predniSONE (STERAPRED UNI-PAK 21 TAB) 10 MG (21) TBPK tablet Take by mouth daily. Take 6 tabs by mouth daily  for 2 days, then 5 tabs for 2 days, then 4 tabs for 2 days, then 3 tabs for 2 days, 2 tabs for 2 days, then 1 tab by mouth daily for 2 days 09/13/20   Jacalyn Lefevre, MD  QVAR 40 MCG/ACT inhaler INHALE 1 PUFF BY MOUTH TWICE DAILY Patient taking differently: Inhale 1 puff into the lungs in the morning and at bedtime. 05/05/19   Tysinger, Kermit Balo, PA-C  valACYclovir (VALTREX) 1000 MG tablet Take 1,000 mg by mouth 3 (three) times daily. 06/22/20   [provider]    Allergies    Patient has no known  allergies.  Review of Systems   Review of Systems  Constitutional: Negative.   HENT: Negative.    Eyes: Negative.   Respiratory:  Positive for cough, shortness of breath and wheezing.   Cardiovascular: Negative.   Gastrointestinal: Negative.   Genitourinary: Negative.   Musculoskeletal: Negative.   Neurological: Negative.    Physical Exam Updated Vital Signs BP (!) 154/97 (BP Location: Left Arm)   Pulse 66   Temp 98.8 F (37.1 C) (Oral)   Resp 16   SpO2 100%   Physical Exam Vitals and nursing note reviewed.  Constitutional:      Appearance: He is not ill-appearing or toxic-appearing.  HENT:     Head: Normocephalic and atraumatic.     Nose: Nose normal. No congestion.     Mouth/Throat:     Mouth: Mucous membranes are moist.     Pharynx: Oropharynx is clear. Uvula midline. No oropharyngeal exudate or posterior oropharyngeal erythema.  Eyes:     General: Lids are normal. Vision grossly intact.        Right eye: No discharge.        Left eye: No discharge.     Extraocular Movements: Extraocular movements intact.     Conjunctiva/sclera: Conjunctivae normal.     Pupils: Pupils are equal, round, and reactive to light.  Neck:     Trachea: Trachea and phonation normal.  Cardiovascular:     Rate and Rhythm: Normal rate and regular rhythm.     Pulses: Normal pulses.     Heart sounds: Normal heart sounds.  Pulmonary:     Effort: Pulmonary effort is normal. No tachypnea, bradypnea, accessory muscle usage, prolonged expiration or respiratory distress.     Breath sounds: Examination of the right-lower field reveals decreased breath sounds. Examination of the left-lower field reveals decreased breath sounds and wheezing. Decreased breath sounds and wheezing present. No rales.  Chest:     Chest wall: No mass, lacerations, deformity, swelling, tenderness, crepitus or edema.  Abdominal:     General: Bowel sounds are normal. There is no distension.     Palpations: Abdomen is soft.      Tenderness: There is no abdominal tenderness. There is no right CVA tenderness, left CVA tenderness, guarding or rebound.  Musculoskeletal:        General: No deformity.     Cervical back: Normal range of motion and neck supple. No edema, rigidity or crepitus. No pain with movement, spinous process tenderness or muscular tenderness.     Right lower leg: No edema.     Left lower leg: No edema.  Lymphadenopathy:     Cervical:  No cervical adenopathy.  Skin:    General: Skin is warm and dry.     Capillary Refill: Capillary refill takes less than 2 seconds.  Neurological:     General: No focal deficit present.     Mental Status: He is alert and oriented to person, place, and time. Mental status is at baseline.     Gait: Gait is intact. Gait normal.  Psychiatric:        Mood and Affect: Mood normal.    ED Results / Procedures / Treatments   Labs (all labs ordered are listed, but only abnormal results are displayed) Labs Reviewed  RESP PANEL BY RT-PCR (FLU A&B, COVID) ARPGX2    EKG None  Radiology DG Chest 2 View  Result Date: 11/01/2020 CLINICAL DATA:  Shortness of breath for 3-4 days. EXAM: CHEST - 2 VIEW COMPARISON:  09/13/2020 FINDINGS: The cardiac silhouette, mediastinal and hilar contours are normal. The lungs are clear. No infiltrates or effusions. No pulmonary lesions. The bony thorax is intact. IMPRESSION: No acute cardiopulmonary findings. Electronically Signed   By: Rudie Meyer M.D.   On: 11/01/2020 15:57    Procedures Procedures   Medications Ordered in ED Medications  predniSONE (DELTASONE) tablet 60 mg (60 mg Oral Given 11/01/20 1736)  ipratropium-albuterol (DUONEB) 0.5-2.5 (3) MG/3ML nebulizer solution 3 mL (3 mLs Nebulization Given 11/01/20 1736)    ED Course  I have reviewed the triage vital signs and the nursing notes.  Pertinent labs & imaging results that were available during my care of the patient were reviewed by me and considered in my medical  decision making (see chart for details).    MDM Rules/Calculators/A&P                         30 year old male with history of asthma presents with 3 days of wheezing and dry cough, increased necessity for his albuterol inhaler.  Differential diagnosis includes is not limited to acute asthma exacerbation, bronchitis, ACS, pleural effusion, pneumonia, viral URI.  Hypertensive on intake, vital signs otherwise normal.  Cardiac exam is normal, pulmonary exam with decreased air movement in the lower lung fields with scant expiratory wheezing in the left lung base. Neurovascular intact in all 4 extremities.  Chest x-ray obtained from triage which was negative for acute cardiopulmonary disease.  Respiratory pathogen panel is negative for COVID and the flu.  Patient administered DuoNeb and oral dose of prednisone.  Reevaluated with significant improvement in his chest tightness.  Improvement in air movement in the lung bases.  Some mild residual expiratory wheezing in the left lung base.  Patient is well-appearing, not requiring any oxygen, and states he feels he is ready to be discharged home.  No further work-up warranted in the ER at this time.  Mr. Nicodemus voiced understanding of his medical evaluation and treatment plan.  Each of his questions was answered to his expressed satisfaction.  Return precautions were given.  Patient is well-appearing, stable, and appropriate for discharge at this time.  Will discharge with albuterol inhaler refill and steroid course.  This chart was dictated using voice recognition software, Dragon. Despite the best efforts of this provider to proofread and correct errors, errors may still occur which can change documentation meaning.  Final Clinical Impression(s) / ED Diagnoses Final diagnoses:  None    Rx / DC Orders ED Discharge Orders     None        Paris Lore, PA-C 11/01/20  7672    Vanetta Mulders, MD 11/03/20 [phone]

## 2021-02-27 ENCOUNTER — Encounter (HOSPITAL_COMMUNITY): Payer: Self-pay

## 2021-02-27 ENCOUNTER — Emergency Department (HOSPITAL_COMMUNITY)
Admission: EM | Admit: 2021-02-27 | Discharge: 2021-02-28 | Disposition: A | Discharge status: Home / Self Care | Payer: Commercial Managed Care - PPO | Attending: Emergency Medicine | Admitting: Emergency Medicine

## 2021-02-27 ENCOUNTER — Emergency Department (HOSPITAL_COMMUNITY): Payer: Commercial Managed Care - PPO

## 2021-02-27 ENCOUNTER — Other Ambulatory Visit: Payer: Self-pay

## 2021-02-27 DIAGNOSIS — J45901 Unspecified asthma with (acute) exacerbation: Secondary | ICD-10-CM | POA: Diagnosis not present

## 2021-02-27 DIAGNOSIS — Z7951 Long term (current) use of inhaled steroids: Secondary | ICD-10-CM | POA: Diagnosis not present

## 2021-02-27 DIAGNOSIS — I10 Essential (primary) hypertension: Secondary | ICD-10-CM | POA: Insufficient documentation

## 2021-02-27 DIAGNOSIS — Z20822 Contact with and (suspected) exposure to covid-19: Secondary | ICD-10-CM | POA: Diagnosis not present

## 2021-02-27 DIAGNOSIS — R059 Cough, unspecified: Secondary | ICD-10-CM | POA: Diagnosis present

## 2021-02-27 LAB — RESP PANEL BY RT-PCR (FLU A&B, COVID) ARPGX2
Influenza A by PCR: NEGATIVE
Influenza B by PCR: NEGATIVE
SARS Coronavirus 2 by RT PCR: NEGATIVE

## 2021-02-27 MED ORDER — ALBUTEROL SULFATE HFA 108 (90 BASE) MCG/ACT IN AERS: 2.0000 | INHALATION_SPRAY | Freq: Once | RESPIRATORY_TRACT | Status: DC

## 2021-02-27 NOTE — ED Triage Notes (Signed)
Pt arrived POV from home. Pt just moved here and was painting his house and had an asthma flare up. Pt states he had an old inhaler that he used that helped a little but he is out of neb treatments.

## 2021-02-27 NOTE — ED Provider Triage Note (Signed)
Emergency Medicine Provider Triage Evaluation Note  Albert Hatfield , a 31 y.o. male  was evaluated in triage.  Pt complains of cough, sob. Feels like prior asthma exacerbations. No back pain, hx of PE, DVT. Using inhaler at home without relief. Prior admission however many years ago  Review of Systems  Positive: cough, sob  Negative:back pain  Physical Exam  BP 123/77 (BP Location: Right Arm)    Pulse 72    Temp 98.7 F (37.1 C) (Oral)    Resp 18    Ht 5\' 11"  (1.803 m)    Wt 103 kg    SpO2 100%    BMI 31.66 kg/m  Gen:   Awake, no distress   Resp:  Normal effort, mild expiratory wheeze on right MSK:   Moves extremities without difficulty, no le edema Other:    Medical Decision Making  Medically screening exam initiated at 1:35 PM.  Appropriate orders placed.  Albert Hatfield was informed that the remainder of the evaluation will be completed by another provider, this initial triage assessment does not replace that evaluation, and the importance of remaining in the ED until their evaluation is complete.  Sob, cough, asthma exacerbation   Albert Hatfield A, PA-C 02/27/21 1337

## 2021-02-28 MED ORDER — ALBUTEROL SULFATE HFA 108 (90 BASE) MCG/ACT IN AERS
2.0000 | INHALATION_SPRAY | Freq: Once | RESPIRATORY_TRACT | Status: AC
  Administered 2021-02-28: 2 via RESPIRATORY_TRACT
  Filled 2021-02-28: qty 6.7

## 2021-02-28 MED ORDER — PREDNISONE 10 MG PO TABS: 40.0000 mg | ORAL_TABLET | Freq: Every day | ORAL | 0 refills | Status: AC

## 2021-02-28 MED ORDER — ALBUTEROL SULFATE HFA 108 (90 BASE) MCG/ACT IN AERS: 1.0000 | INHALATION_SPRAY | Freq: Four times a day (QID) | RESPIRATORY_TRACT | 0 refills | Status: AC | PRN

## 2021-02-28 NOTE — Discharge Instructions (Signed)
You were seen in the ER for an asthma exacerbation.  Please take steroids for the next 5 days.  Use albuterol inhaler every 4-6 hours.  Follow up with primary care within 1 week. Return to the ER for new or worsening symptoms or any other concerns.

## 2021-02-28 NOTE — ED Provider Notes (Signed)
[facility] EMERGENCY DEPARTMENT Provider Note   CSN: [id number] Arrival date & time: 02/27/21  1214     History  Chief Complaint  Patient presents with   Asthma    Albert Hatfield is a 31 y.o. male w/ a hx of asthma, allergies, HTN, and OSA who presents to the ED with complaints of asthma flare today. 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 Tried inhaler without initial relief, used last few puffs. Since being in the ED sxs have subsided over time. He does feel similar to like when he has needed steroids in the past. Denies fever, vomiting, chest pain, leg swelling, hemoptysis, or syncope.   HPI     Home Medications Prior to Admission medications   Medication Sig Start Date End Date Taking? Authorizing Provider  albuterol (VENTOLIN HFA) 108 (90 Base) MCG/ACT inhaler Inhale 1-2 puffs into the lungs every 6 (six) hours as needed for wheezing or shortness of breath. 02/28/21  Yes Johnpatrick Jenny R, PA-C  predniSONE (DELTASONE) 10 MG tablet Take 4 tablets (40 mg total) by mouth daily for 5 days. 02/28/21 03/05/21 Yes Chrissa Meetze, Pleas Koch, PA-C  acetaminophen (TYLENOL) 325 MG tablet Take 650 mg by mouth every 6 (six) hours as needed for headache or mild pain.    [provider]  Ascorbic Acid (VITAMIN C) 1000 MG tablet Take 1,000 mg by mouth daily.    [provider]  emtricitabine-tenofovir AF (DESCOVY) 200-25 MG tablet Take 1 tablet by mouth daily. 07/21/19   Tysinger, Kermit Balo, PA-C  multivitamin (ONE-A-DAY MEN'S) TABS tablet Take 1 tablet by mouth daily.    [provider]  QVAR 40 MCG/ACT inhaler INHALE 1 PUFF BY MOUTH TWICE DAILY Patient taking differently: Inhale 1 puff into the lungs in the morning and at bedtime. 05/05/19   Tysinger, Kermit Balo, PA-C  valACYclovir (VALTREX) 1000 MG tablet Take 1,000 mg by mouth 3 (three) times daily. 06/22/20    [provider]      Allergies    Patient has no known allergies.    Review of Systems   Review of Systems  Constitutional:  Negative for chills and fever.  Respiratory:  Positive for cough, shortness of breath and wheezing.   Cardiovascular:  Negative for chest pain and leg swelling.  Gastrointestinal:  Negative for abdominal pain and vomiting.  Neurological:  Negative for syncope.  All other systems reviewed and are negative.  Physical Exam Updated Vital Signs BP (!) 135/94    Pulse 71    Temp 98 F (36.7 C) (Oral)    Resp 18    Ht 5\' 11"  (1.803 m)    Wt 103 kg    SpO2 99%    BMI 31.66 kg/m  Physical Exam Vitals and nursing note reviewed.  Constitutional:      General: He is not in acute distress.    Appearance: He is well-developed. He is not toxic-appearing.  HENT:     Head: Normocephalic and atraumatic.  Eyes:     General:        Right eye: No discharge.        Left eye: No discharge.     Conjunctiva/sclera: Conjunctivae normal.  Cardiovascular:     Rate and Rhythm: Normal rate and regular rhythm.  Pulmonary:     Effort: Pulmonary effort is normal. No respiratory distress.     Breath sounds: Normal breath sounds.  No wheezing, rhonchi or rales.  Abdominal:     General: There is no distension.     Palpations: Abdomen is soft.     Tenderness: There is no abdominal tenderness.  Musculoskeletal:     Cervical back: Neck supple.  Skin:    General: Skin is warm and dry.     Findings: No rash.  Neurological:     Mental Status: He is alert.     Comments: Clear speech.   Psychiatric:        Behavior: Behavior normal.    ED Results / Procedures / Treatments   Labs (all labs ordered are listed, but only abnormal results are displayed) Labs Reviewed  RESP PANEL BY RT-PCR (FLU A&B, COVID) [id number]    EKG None  Radiology DG Chest 2 View  Result Date: 02/27/2021 CLINICAL DATA:  Cough, shortness of breath, asthma exacerbation. Chest tightness. EXAM: CHEST -  2 VIEW COMPARISON:  11/01/2020. FINDINGS: Trachea is midline. Heart size normal. Lungs are clear. No pleural fluid. IMPRESSION: No acute findings. Electronically Signed   By: Leanna Battles M.D.   On: 02/27/2021 13:58    Procedures Procedures    Medications Ordered in ED Medications  albuterol (VENTOLIN HFA) 108 (90 Base) MCG/ACT inhaler 2 puff (2 puffs Inhalation Given 02/28/21 0029)    ED Course/ Medical Decision Making/ A&P                           Medical Decision Making Risk Prescription drug management.  Patient presents to the ED with complaints of dyspnea, cough, wheezing, this involves an extensive number of treatment options, and is a complaint that carries with it a high risk of complications and morbidity. Nontoxic, vitals, with mild bradycardia at times, normal HR on exam.   Additional history obtained:  Chart review- prior ED visits  EKG: no STEMI  Lab Tests:  I reviewed & interpreted labs including:  Covid/flu: negative.  Imaging Studies ordered:  I ordered reviewed and interpreted imaging including:  CXR:  No acute findings  ED Course:  Patient presenting w/ respiratory sxs. Covid/flu negative. CXR w/o pneumonia, pneumothorax or fluid overload. Low risk wells- doubt PE. Sxs improved after waiting several hours, but initially persisted after typical inhaler use, feels like when he has needed steroids previously. Currently lungs are clear, no respiratory distress, appears appropriate for discharge. New inhaler provided in the ED and steroid burst sent to pharmacy. I discussed results, treatment plan, need for follow-up, and return precautions with the patient. Provided opportunity for questions, patient confirmed understanding and is in agreement with plan.   Portions of this note were generated with Scientist, clinical (histocompatibility and immunogenetics). Dictation errors may occur despite best attempts at proofreading.         Final Clinical Impression(s) / ED Diagnoses Final  diagnoses:  Exacerbation of asthma, unspecified asthma severity, unspecified whether persistent    Rx / DC Orders ED Discharge Orders          Ordered    predniSONE (DELTASONE) 10 MG tablet  Daily        02/28/21 0015    albuterol (VENTOLIN HFA) 108 (90 Base) MCG/ACT inhaler  Every 6 hours PRN        02/28/21 0015              Cherly Anderson, PA-C 02/28/21 0040    Nira Conn, MD 02/28/21 [phone]

## 2022-02-16 ENCOUNTER — Other Ambulatory Visit: Payer: Self-pay

## 2022-02-16 ENCOUNTER — Encounter (HOSPITAL_BASED_OUTPATIENT_CLINIC_OR_DEPARTMENT_OTHER): Payer: Self-pay | Admitting: Emergency Medicine

## 2022-02-16 ENCOUNTER — Emergency Department (HOSPITAL_BASED_OUTPATIENT_CLINIC_OR_DEPARTMENT_OTHER)
Admission: EM | Admit: 2022-02-16 | Discharge: 2022-02-16 | Disposition: A | Discharge status: Home / Self Care | Payer: Commercial Managed Care - PPO | Attending: Emergency Medicine | Admitting: Emergency Medicine

## 2022-02-16 DIAGNOSIS — L03012 Cellulitis of left finger: Secondary | ICD-10-CM

## 2022-02-16 NOTE — ED Provider Notes (Signed)
[facility] Provider Note   CSN: [id number] Arrival date & time: 02/16/22  1156     History {Add pertinent medical, surgical, social history, OB history to HPI:1} Chief Complaint  Patient presents with   Hand Pain    Albert Hatfield is a 32 y.o. male.  Patient is a 32 year old male presenting for pain, swelling, and redness at the distal tip of his left middle finger.  Patient states this happened several times in the past but has never required drainage.  States he has been attempting to apply heat, Epsom salts, alcohol, hydrocortisone, and bacitracin with minimal improvement.  The history is provided by the patient. No language interpreter was used.  Hand Pain       Home Medications Prior to Admission medications   Medication Sig Start Date End Date Taking? Authorizing Provider  acetaminophen (TYLENOL) 325 MG tablet Take 650 mg by mouth every 6 (six) hours as needed for headache or mild pain.    [provider]  albuterol (VENTOLIN HFA) 108 (90 Base) MCG/ACT inhaler Inhale 1-2 puffs into the lungs every 6 (six) hours as needed for wheezing or shortness of breath. 02/28/21   Petrucelli, Glynda Jaeger, PA-C  Ascorbic Acid (VITAMIN C) 1000 MG tablet Take 1,000 mg by mouth daily.    [provider]  emtricitabine-tenofovir AF (DESCOVY) 200-25 MG tablet Take 1 tablet by mouth daily. 07/21/19   Tysinger, Camelia Eng, PA-C  multivitamin (ONE-A-DAY MEN'S) TABS tablet Take 1 tablet by mouth daily.    [provider]  QVAR 40 MCG/ACT inhaler INHALE 1 PUFF BY MOUTH TWICE DAILY Patient taking differently: Inhale 1 puff into the lungs in the morning and at bedtime. 05/05/19   Tysinger, Camelia Eng, PA-C  valACYclovir (VALTREX) 1000 MG tablet Take 1,000 mg by mouth 3 (three) times daily. 06/22/20   [provider]      Allergies    Patient has no known allergies.    Review of Systems   Review of Systems  Constitutional:   Negative for chills and fever.  Skin:  Positive for color change and wound.  Neurological:  Negative for weakness and numbness.    Physical Exam Updated Vital Signs BP (!) 150/103 (BP Location: Right Wrist)   Pulse (!) 105   Temp 98.7 F (37.1 C)   Resp 18   Ht 5\' 11"  (1.803 m)   Wt 120.7 kg   SpO2 98%   BMI 37.10 kg/m  Physical Exam Vitals and nursing note reviewed.  Constitutional:      Appearance: Normal appearance.  HENT:     Head: Normocephalic and atraumatic.  Cardiovascular:     Rate and Rhythm: Normal rate.     Pulses:          Radial pulses are 2+ on the right side and 2+ on the left side.  Pulmonary:     Effort: Pulmonary effort is normal.  Skin:    General: Skin is warm.     Capillary Refill: Capillary refill takes less than 2 seconds.  Neurological:     Mental Status: He is alert.     Sensory: Sensation is intact.     Motor: Motor function is intact.     ED Results / Procedures / Treatments   Labs (all labs ordered are listed, but only abnormal results are displayed) Labs Reviewed - No data to display  EKG None  Radiology No results found.  Procedures .Marland KitchenIncision and Drainage  Date/Time:  02/16/2022 2:11 PM  Performed by: Lianne Cure, DO Authorized by: Lianne Cure, DO     {Document cardiac monitor, telemetry assessment procedure when appropriate:1}  Medications Ordered in ED Medications - No data to display  ED Course/ Medical Decision Making/ A&P   {   Click here for ABCD2, HEART and other calculatorsREFRESH Note before signing :1}                          Medical Decision Making  ***  {Document critical care time when appropriate:1} {Document review of labs and clinical decision tools ie heart score, Chads2Vasc2 etc:1}  {Document your independent review of radiology images, and any outside records:1} {Document your discussion with family members, caretakers, and with consultants:1} {Document social determinants of health  affecting pt's care:1} {Document your decision making why or why not admission, treatments were needed:1} Final Clinical Impression(s) / ED Diagnoses Final diagnoses:  None    Rx / DC Orders ED Discharge Orders     None

## 2022-02-16 NOTE — ED Triage Notes (Signed)
Here by POV from home for L middle finger pain c/w paronychia. L middle finger cuticle swollen, red & yellow, painful, TTP, and with heat. No drainage. Unsure of fever. No relief with tylenol, motrin, epsom salt, peroxide, alcohol, hydrocortisone cream or bacitracin. Onset 2 weeks ago. Gradually worse.

## 2022-04-21 ENCOUNTER — Other Ambulatory Visit (HOSPITAL_BASED_OUTPATIENT_CLINIC_OR_DEPARTMENT_OTHER): Payer: Self-pay

## 2022-04-21 ENCOUNTER — Encounter (HOSPITAL_BASED_OUTPATIENT_CLINIC_OR_DEPARTMENT_OTHER): Payer: Self-pay | Admitting: Emergency Medicine

## 2022-04-21 ENCOUNTER — Other Ambulatory Visit: Payer: Self-pay

## 2022-04-21 ENCOUNTER — Emergency Department (HOSPITAL_BASED_OUTPATIENT_CLINIC_OR_DEPARTMENT_OTHER): Payer: No Typology Code available for payment source | Admitting: Radiology

## 2022-04-21 ENCOUNTER — Emergency Department (HOSPITAL_BASED_OUTPATIENT_CLINIC_OR_DEPARTMENT_OTHER)
Admission: EM | Admit: 2022-04-21 | Discharge: 2022-04-21 | Disposition: A | Discharge status: Home / Self Care | Payer: No Typology Code available for payment source | Attending: Emergency Medicine | Admitting: Emergency Medicine

## 2022-04-21 DIAGNOSIS — S80211A Abrasion, right knee, initial encounter: Secondary | ICD-10-CM | POA: Diagnosis not present

## 2022-04-21 DIAGNOSIS — W109XXA Fall (on) (from) unspecified stairs and steps, initial encounter: Secondary | ICD-10-CM | POA: Insufficient documentation

## 2022-04-21 DIAGNOSIS — M25531 Pain in right wrist: Secondary | ICD-10-CM | POA: Diagnosis present

## 2022-04-21 DIAGNOSIS — S139XXD Sprain of joints and ligaments of unspecified parts of neck, subsequent encounter: Secondary | ICD-10-CM | POA: Diagnosis not present

## 2022-04-21 DIAGNOSIS — S60511A Abrasion of right hand, initial encounter: Secondary | ICD-10-CM | POA: Insufficient documentation

## 2022-04-21 DIAGNOSIS — Z23 Encounter for immunization: Secondary | ICD-10-CM | POA: Insufficient documentation

## 2022-04-21 DIAGNOSIS — S63501A Unspecified sprain of right wrist, initial encounter: Secondary | ICD-10-CM | POA: Diagnosis not present

## 2022-04-21 DIAGNOSIS — M545 Low back pain, unspecified: Secondary | ICD-10-CM | POA: Diagnosis not present

## 2022-04-21 MED ORDER — LIDOCAINE 5 % EX PTCH
1.0000 | MEDICATED_PATCH | CUTANEOUS | 0 refills | Status: AC
  Filled 2022-04-21: qty 30, 30d supply, fill #0

## 2022-04-21 MED ORDER — IBUPROFEN 800 MG PO TABS
800.0000 mg | ORAL_TABLET | Freq: Once | ORAL | Status: AC
  Administered 2022-04-21: 800 mg via ORAL
  Filled 2022-04-21: qty 1

## 2022-04-21 MED ORDER — TETANUS-DIPHTH-ACELL PERTUSSIS 5-2.5-18.5 LF-MCG/0.5 IM SUSY
0.5000 mL | PREFILLED_SYRINGE | Freq: Once | INTRAMUSCULAR | Status: AC
  Administered 2022-04-21: 0.5 mL via INTRAMUSCULAR
  Filled 2022-04-21: qty 0.5

## 2022-04-21 MED ORDER — LIDOCAINE 5 % EX PTCH
1.0000 | MEDICATED_PATCH | Freq: Once | CUTANEOUS | Status: DC
  Administered 2022-04-21: 1 via TRANSDERMAL
  Filled 2022-04-21: qty 1

## 2022-04-21 NOTE — Discharge Instructions (Addendum)
You were seen in the emergency department for neck pain and wrist pain after your fall.  Your x-ray showed no broken bones and you likely sprained your wrist.  We have given you an Ace wrap that you should wear for support and you can ice your wrist and keep it elevated to help with the swelling.  You can take Tylenol and Motrin as needed for pain and both can be taken together every 6 hours or alternated every 3.  You can use lidocaine patches as well.  You can follow-up with your primary doctor in the next few days to have your symptoms rechecked.  You should return to the emergency department for numbness or weakness in your arms or legs, significantly worsening pain or if you have any other new or concerning symptoms.

## 2022-04-21 NOTE — ED Notes (Signed)
Discharge paperwork given and verbally understood. 
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
## 2022-04-21 NOTE — ED Triage Notes (Signed)
Pt arrives pov, slow gait with c/o mechanical fall yesterday when going downstairs. Pt c/o RT wrist pain. Pt denies dizziness, denies head injury. Endorses  1g tylenol @ 0430

## 2022-04-28 ENCOUNTER — Other Ambulatory Visit (HOSPITAL_BASED_OUTPATIENT_CLINIC_OR_DEPARTMENT_OTHER): Payer: Self-pay

## 2022-12-21 IMAGING — CR DG CHEST 2V
2 series · 2 of 2 positions shown · non-contrast
Comparison: 11/01/2020.

CLINICAL DATA: Cough, shortness of breath, asthma exacerbation.
Chest tightness.

EXAM:
CHEST - 2 VIEW

[chest pa]
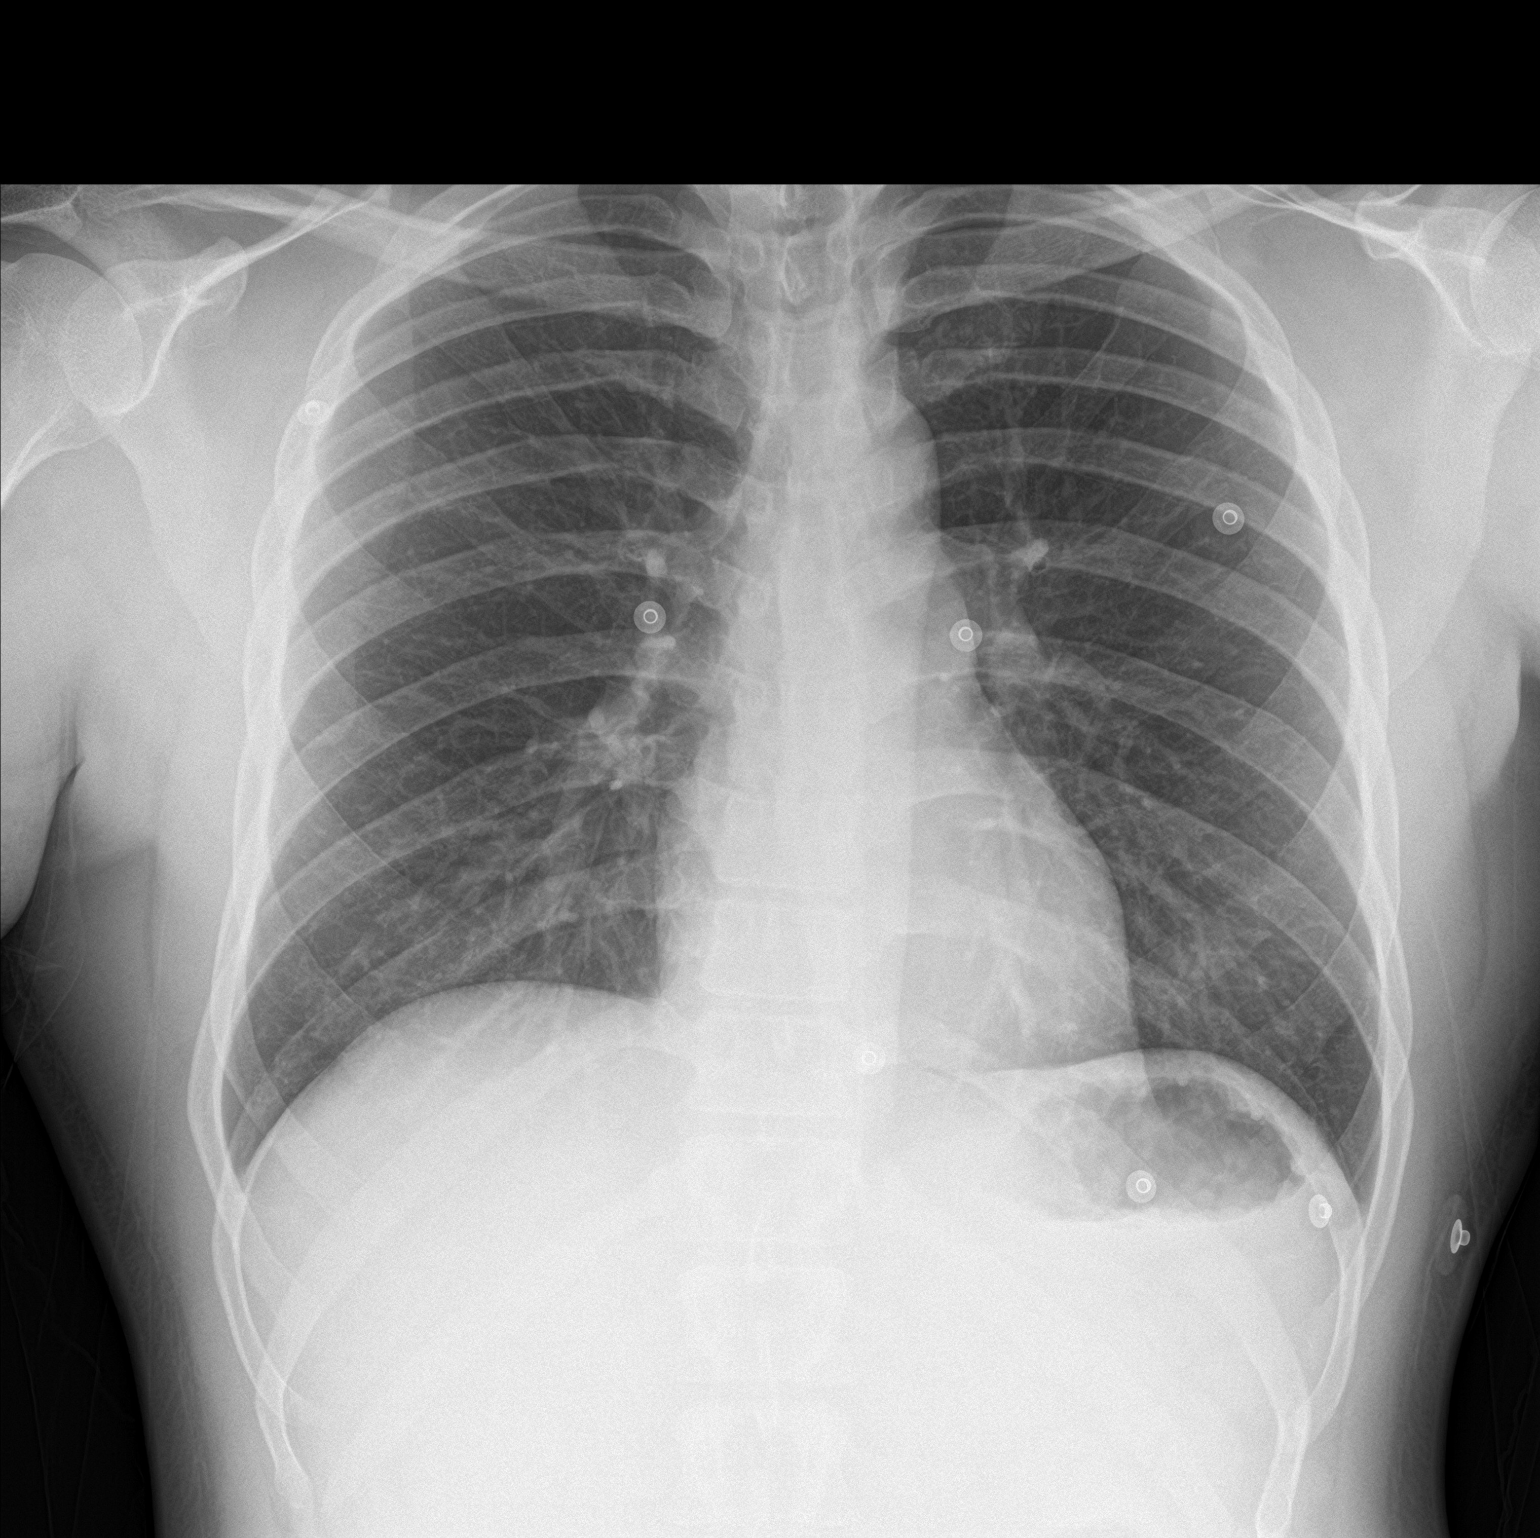

[chest lat]
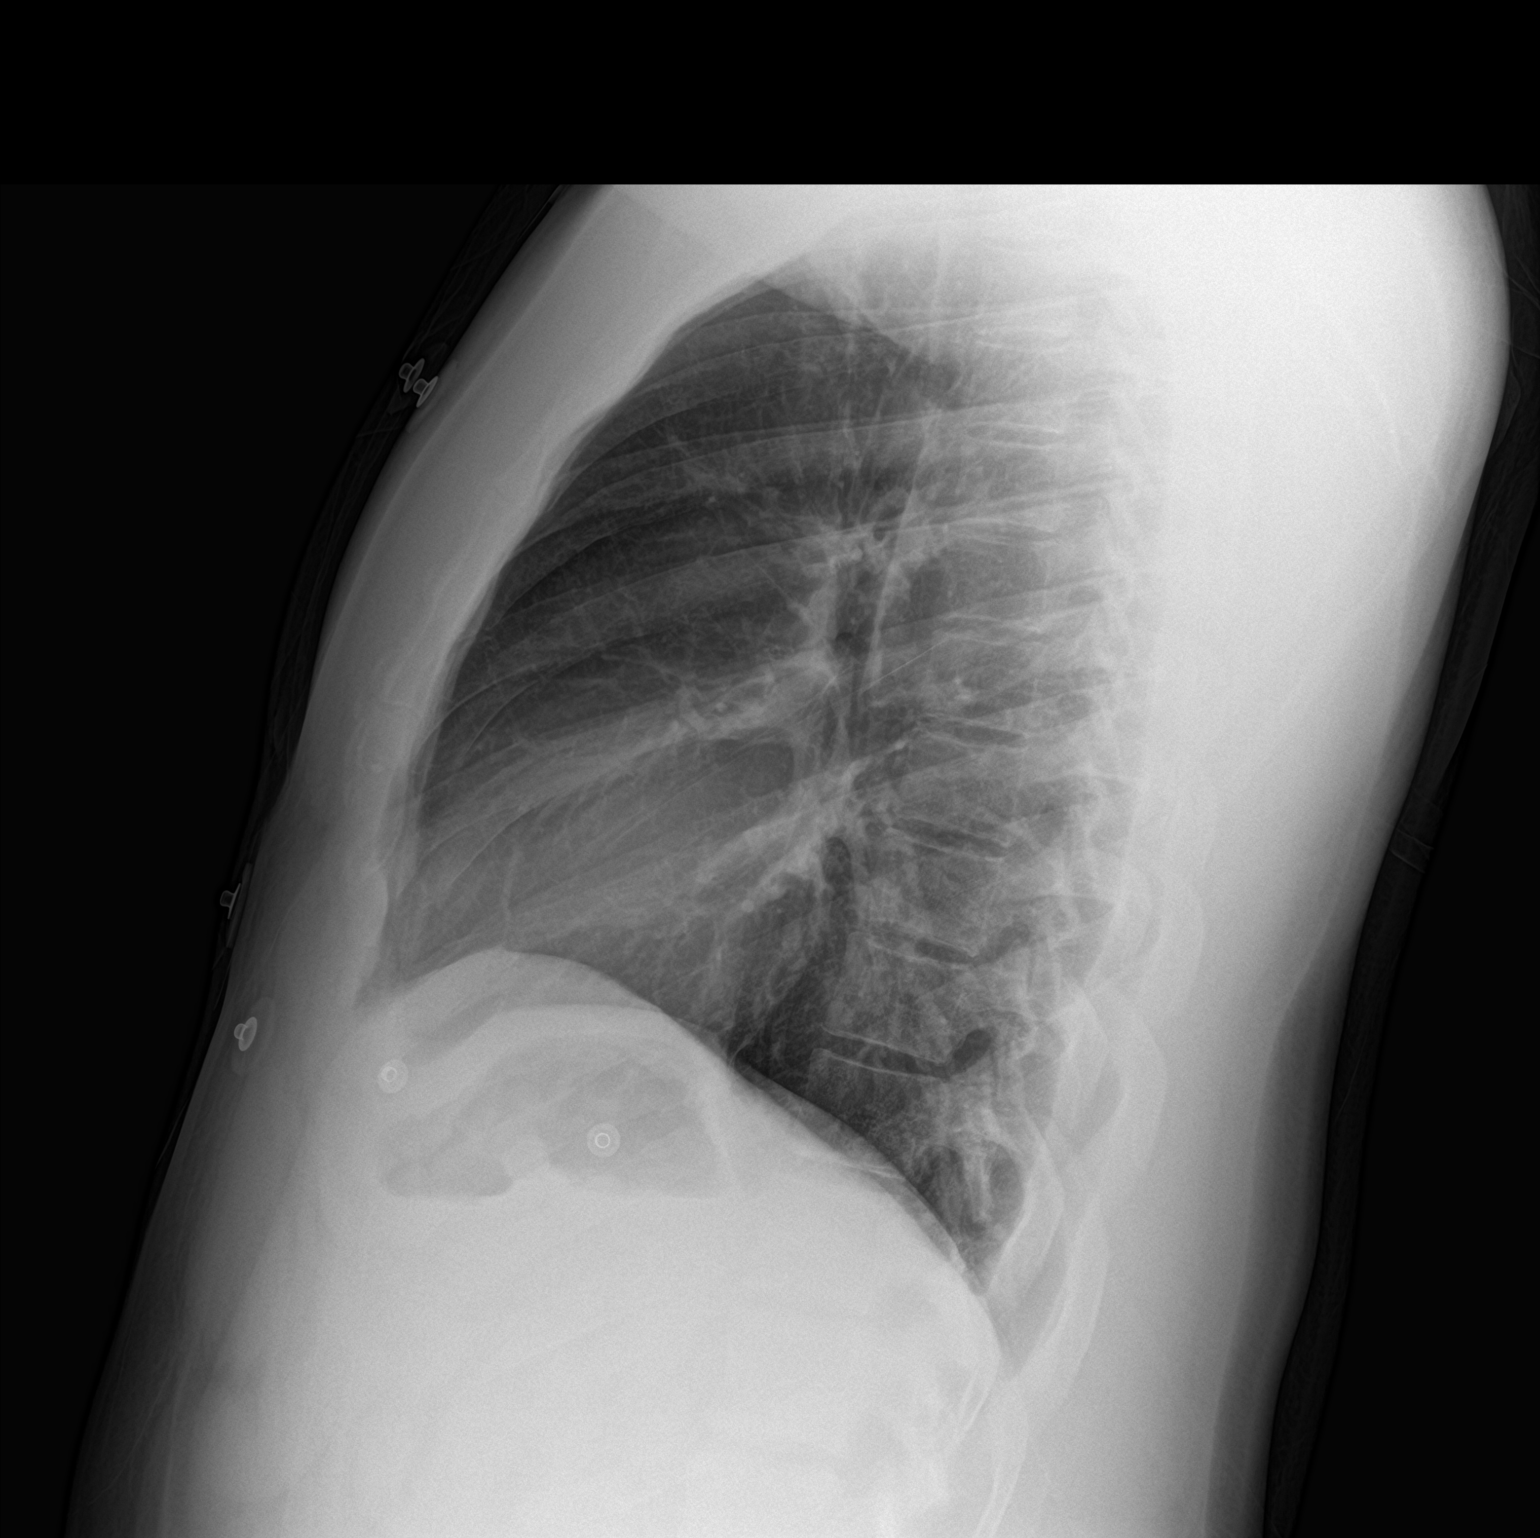

[2 of 2 positions shown; findings below may reference images not displayed]

FINDINGS: Trachea is midline. Heart size normal. Lungs are clear. No pleural
fluid.
IMPRESSION: No acute findings.
